# Patient Record
Sex: Female | Born: 1960 | Race: Black or African American | Hispanic: No | Marital: Single | Smoking: Current every day smoker
Health system: Southern US, Community
[De-identification: ages and names within clinical notes are randomized; demographics above are authoritative.]

## PROBLEM LIST (undated history)

## (undated) DIAGNOSIS — M5430 Sciatica, unspecified side: Secondary | ICD-10-CM

## (undated) DIAGNOSIS — G43909 Migraine, unspecified, not intractable, without status migrainosus: Secondary | ICD-10-CM

## (undated) HISTORY — PX: SHOULDER SURGERY: SHX246

## (undated) HISTORY — PX: ABDOMINAL HYSTERECTOMY: SHX81

## (undated) HISTORY — PX: CHOLECYSTECTOMY: SHX55

---

## 2005-10-04 HISTORY — PX: CERVICAL SPINE SURGERY: SHX589

## 2013-05-25 ENCOUNTER — Encounter (HOSPITAL_BASED_OUTPATIENT_CLINIC_OR_DEPARTMENT_OTHER): Payer: Self-pay

## 2013-05-25 ENCOUNTER — Emergency Department (HOSPITAL_BASED_OUTPATIENT_CLINIC_OR_DEPARTMENT_OTHER)
Admission: EM | Admit: 2013-05-25 | Discharge: 2013-05-25 | Disposition: A | Discharge status: Home / Self Care | Payer: Medicare Other | Attending: Emergency Medicine | Admitting: Emergency Medicine

## 2013-05-25 DIAGNOSIS — M5432 Sciatica, left side: Secondary | ICD-10-CM

## 2013-05-25 DIAGNOSIS — G43909 Migraine, unspecified, not intractable, without status migrainosus: Secondary | ICD-10-CM | POA: Insufficient documentation

## 2013-05-25 DIAGNOSIS — M79609 Pain in unspecified limb: Secondary | ICD-10-CM | POA: Insufficient documentation

## 2013-05-25 DIAGNOSIS — F17200 Nicotine dependence, unspecified, uncomplicated: Secondary | ICD-10-CM | POA: Insufficient documentation

## 2013-05-25 DIAGNOSIS — M5430 Sciatica, unspecified side: Secondary | ICD-10-CM | POA: Insufficient documentation

## 2013-05-25 DIAGNOSIS — Z9889 Other specified postprocedural states: Secondary | ICD-10-CM | POA: Insufficient documentation

## 2013-05-25 HISTORY — DX: Migraine, unspecified, not intractable, without status migrainosus: G43.909

## 2013-05-25 HISTORY — DX: Sciatica, unspecified side: M54.30

## 2013-05-25 MED ORDER — DIAZEPAM 5 MG PO TABS: 5.0000 mg | ORAL_TABLET | Freq: Two times a day (BID) | ORAL | Status: DC

## 2013-05-25 MED ORDER — IBUPROFEN 600 MG PO TABS: 600.0000 mg | ORAL_TABLET | Freq: Three times a day (TID) | ORAL | Status: AC

## 2013-05-25 MED ORDER — HYDROMORPHONE HCL PF 1 MG/ML IJ SOLN
1.0000 mg | Freq: Once | INTRAMUSCULAR | Status: AC
  Administered 2013-05-25: 1 mg via INTRAMUSCULAR
  Filled 2013-05-25: qty 1

## 2013-05-25 MED ORDER — OXYCODONE-ACETAMINOPHEN 5-325 MG PO TABS: 2.0000 | ORAL_TABLET | ORAL | Status: DC | PRN

## 2013-05-25 NOTE — ED Provider Notes (Signed)
CSN: [id number]     Arrival date & time 05/25/13  1835 History     First MD Initiated Contact with Patient 05/25/13 1851     Chief Complaint  Patient presents with  . Sciatica   (Consider location/radiation/quality/duration/timing/severity/associated sxs/prior Treatment) HPI Patient presents with pain in her low back, left leg.  This episode began approximately 3 days ago without clear precipitant.  Since onset the pain has been severe, worsening.  Pain radiates down the posterior of the left leg.  There is an associated heavy sensation, but no loss of strength or sensation.  No concurrent incontinence, fever, chills, belly pain. Patient's long history of sciatica, states that this was the same as multiple prior events.   Past Medical History  Diagnosis Date  . Sciatica   . Migraine    Past Surgical History  Procedure Laterality Date  . Shoulder surgery    . Cervical spine surgery    . Abdominal hysterectomy     No family history on file. History  Substance Use Topics  . Smoking status: Current Every Day Smoker  . Smokeless tobacco: Not on file  . Alcohol Use: No   OB History   Grav Para Term Preterm Abortions TAB SAB Ect Mult Living                 Review of Systems  All other systems reviewed and are negative.    Allergies  Review of patient's allergies indicates not on file.  Home Medications   Current Outpatient Rx  Name  Route  Sig  Dispense  Refill  . butalbital-acetaminophen-caffeine (FIORICET WITH CODEINE) 50-325-40-30 MG per capsule   Oral   Take 1 capsule by mouth every 4 (four) hours as needed for headache.         . diazepam (VALIUM) 5 MG tablet   Oral   Take 1 tablet (5 mg total) by mouth 2 (two) times daily.   10 tablet   0   . ibuprofen (ADVIL,MOTRIN) 600 MG tablet   Oral   Take 1 tablet (600 mg total) by mouth 3 (three) times daily.   10 tablet   0   . oxyCODONE-acetaminophen (PERCOCET/ROXICET) 5-325 MG per tablet   Oral   Take 2  tablets by mouth every 4 (four) hours as needed for pain.   6 tablet   0    BP 121/84  Pulse 93  Temp(Src) 98.9 F (37.2 C) (Oral)  Resp 16  Ht 5\' 7"  (1.702 m)  Wt 145 lb (65.772 kg)  BMI 22.71 kg/m2  SpO2 100% Physical Exam  Nursing note and vitals reviewed. Constitutional: She is oriented to person, place, and time. She appears well-developed and well-nourished. No distress.  HENT:  Head: Normocephalic and atraumatic.  Eyes: Conjunctivae and EOM are normal.  Cardiovascular: Normal rate and regular rhythm.   Pulmonary/Chest: Effort normal and breath sounds normal. No stridor. No respiratory distress.  Abdominal: She exhibits no distension.  Musculoskeletal: She exhibits no edema.  No gross deformity.  Positive straight leg test on the left.  Patient can flex and extend both hips, has appropriate range of motion, but has hesitancy to perform range of motion secondary to pain in the left low back, left posterior hip.  Neurological: She is alert and oriented to person, place, and time. No cranial nerve deficit.  Skin: Skin is warm and dry.  Psychiatric: She has a normal mood and affect.    ED Course   Procedures (including critical  care time)  Labs Reviewed - No data to display No results found. 1. Sciatica, left     MDM  Generally well-appearing female presents with left lower back and leg pain.  Given her history of sciatica, her endorsement of similar symptoms in the past, the absence of fever or other red flags, she was started on anti-inflammatories, analgesics and discharged in stable condition.  Gerhard Munch, MD 05/25/13 2027

## 2013-05-25 NOTE — ED Notes (Signed)
Reports been going on for 3 months worse over last 3-4 weeks but appt with neurologist not until mid sept.

## 2013-05-25 NOTE — ED Notes (Signed)
Pt reports "i have sciatic nerve"-pain to lower back and left leg-steady gait into triage

## 2013-05-25 NOTE — ED Notes (Signed)
Pt given dilaudid shot, will wait in consultation room until 2012.

## 2013-06-04 ENCOUNTER — Emergency Department (HOSPITAL_BASED_OUTPATIENT_CLINIC_OR_DEPARTMENT_OTHER)
Admission: EM | Admit: 2013-06-04 | Discharge: 2013-06-04 | Disposition: A | Discharge status: Home / Self Care | Payer: Medicare Other | Attending: Emergency Medicine | Admitting: Emergency Medicine

## 2013-06-04 ENCOUNTER — Encounter (HOSPITAL_BASED_OUTPATIENT_CLINIC_OR_DEPARTMENT_OTHER): Payer: Self-pay | Admitting: *Deleted

## 2013-06-04 DIAGNOSIS — R209 Unspecified disturbances of skin sensation: Secondary | ICD-10-CM | POA: Insufficient documentation

## 2013-06-04 DIAGNOSIS — M79609 Pain in unspecified limb: Secondary | ICD-10-CM | POA: Insufficient documentation

## 2013-06-04 DIAGNOSIS — M5432 Sciatica, left side: Secondary | ICD-10-CM

## 2013-06-04 DIAGNOSIS — Z8679 Personal history of other diseases of the circulatory system: Secondary | ICD-10-CM | POA: Insufficient documentation

## 2013-06-04 DIAGNOSIS — M5430 Sciatica, unspecified side: Secondary | ICD-10-CM | POA: Insufficient documentation

## 2013-06-04 DIAGNOSIS — F17200 Nicotine dependence, unspecified, uncomplicated: Secondary | ICD-10-CM | POA: Insufficient documentation

## 2013-06-04 MED ORDER — DIAZEPAM 5 MG/ML IJ SOLN
5.0000 mg | Freq: Once | INTRAMUSCULAR | Status: AC
  Administered 2013-06-04: 5 mg via INTRAMUSCULAR
  Filled 2013-06-04: qty 2

## 2013-06-04 MED ORDER — HYDROMORPHONE HCL PF 1 MG/ML IJ SOLN
1.0000 mg | Freq: Once | INTRAMUSCULAR | Status: AC
  Administered 2013-06-04: 1 mg via INTRAMUSCULAR
  Filled 2013-06-04: qty 1

## 2013-06-04 MED ORDER — DEXAMETHASONE SODIUM PHOSPHATE 10 MG/ML IJ SOLN
10.0000 mg | Freq: Once | INTRAMUSCULAR | Status: AC
  Administered 2013-06-04: 10 mg via INTRAMUSCULAR
  Filled 2013-06-04: qty 1

## 2013-06-04 MED ORDER — DIAZEPAM 5 MG PO TABS: 5.0000 mg | ORAL_TABLET | Freq: Four times a day (QID) | ORAL | Status: DC | PRN

## 2013-06-04 MED ORDER — OXYCODONE-ACETAMINOPHEN 5-325 MG PO TABS: 1.0000 | ORAL_TABLET | ORAL | Status: DC | PRN

## 2013-06-04 NOTE — ED Notes (Signed)
rx x 2 for percocet and valium- pt has a ride

## 2013-06-04 NOTE — ED Provider Notes (Signed)
CSN: [id number]     Arrival date & time 06/04/13  2013 History   First MD Initiated Contact with Patient 06/04/13 2115     Chief Complaint  Patient presents with  . Back Pain   (Consider location/radiation/quality/duration/timing/severity/associated sxs/prior Treatment) HPI Comments: Patient returns tonight with continued lower back pain with radiation down the back of her left leg - she reports she has an appointment with a neurologist in 20 days but cannot get into see him sooner - she is out of pain medicaiton .  She denies numbness or tingling but reports periods of "weird sensation" to her left thigh and leg, she denies saddle anesthesia, nausea, vomiting, weakness or urinary retention.  Patient is a 52 y.o. female presenting with back pain. The history is provided by the patient. No language interpreter was used.  Back Pain Location:  Lumbar spine and sacro-iliac joint Quality:  Stabbing Radiates to:  L posterior upper leg Pain severity:  Severe Pain is:  Worse during the night Onset quality:  Gradual Timing:  Constant Progression:  Worsening Chronicity:  Chronic Context: physical stress   Context: not emotional stress, not falling, not jumping from heights, not lifting heavy objects, not MVA, not occupational injury, not pedestrian accident, not recent illness, not recent injury and not twisting   Relieved by:  Nothing Worsened by:  Nothing tried Associated symptoms: leg pain and paresthesias   Associated symptoms: no abdominal pain, no bladder incontinence, no bowel incontinence, no chest pain, no dysuria, no fever, no headaches, no numbness, no pelvic pain, no perianal numbness, no tingling, no weakness and no weight loss     Past Medical History  Diagnosis Date  . Sciatica   . Migraine    Past Surgical History  Procedure Laterality Date  . Shoulder surgery    . Cervical spine surgery    . Abdominal hysterectomy     No family history on file. History  Substance Use  Topics  . Smoking status: Current Every Day Smoker  . Smokeless tobacco: Not on file  . Alcohol Use: No   OB History   Grav Para Term Preterm Abortions TAB SAB Ect Mult Living                 Review of Systems  Constitutional: Negative for fever and weight loss.  Cardiovascular: Negative for chest pain.  Gastrointestinal: Negative for abdominal pain and bowel incontinence.  Genitourinary: Negative for bladder incontinence, dysuria and pelvic pain.  Musculoskeletal: Positive for back pain.  Neurological: Positive for paresthesias. Negative for tingling, weakness, numbness and headaches.  All other systems reviewed and are negative.    Allergies  Review of patient's allergies indicates no known allergies.  Home Medications   Current Outpatient Rx  Name  Route  Sig  Dispense  Refill  . butalbital-acetaminophen-caffeine (FIORICET WITH CODEINE) 50-325-40-30 MG per capsule   Oral   Take 1 capsule by mouth every 4 (four) hours as needed for headache.         . diazepam (VALIUM) 5 MG tablet   Oral   Take 1 tablet (5 mg total) by mouth every 6 (six) hours as needed for anxiety.   20 tablet   0   . oxyCODONE-acetaminophen (PERCOCET/ROXICET) 5-325 MG per tablet   Oral   Take 1 tablet by mouth every 4 (four) hours as needed for pain.   20 tablet   0    BP 150/92  Pulse 90  Temp(Src) 98.5 F (36.9 C) (Oral)  Resp 18  Ht 5\' 7"  (1.702 m)  Wt 145 lb (65.772 kg)  BMI 22.71 kg/m2  SpO2 100% Physical Exam  Nursing note and vitals reviewed. Constitutional: She appears well-developed and well-nourished. No distress.  HENT:  Head: Normocephalic and atraumatic.  Right Ear: External ear normal.  Left Ear: External ear normal.  Nose: Nose normal.  Mouth/Throat: Oropharynx is clear and moist. No oropharyngeal exudate.  Eyes: Conjunctivae are normal. Pupils are equal, round, and reactive to light. No scleral icterus.  Neck: Normal range of motion. Neck supple.  Cardiovascular:  Normal rate, regular rhythm and normal heart sounds.  Exam reveals no gallop and no friction rub.   No murmur heard. Pulmonary/Chest: Effort normal and breath sounds normal. No respiratory distress. She has no wheezes. She has no rales. She exhibits no tenderness.  Abdominal: Soft. Bowel sounds are normal. She exhibits no distension. There is no tenderness.  Musculoskeletal: Normal range of motion. She exhibits no edema and no tenderness.  Pain at L4-L5 - pain with straight leg raise and SI joint pain  Lymphadenopathy:    She has no cervical adenopathy.  Neurological: She is alert. She has normal reflexes. No cranial nerve deficit. She exhibits normal muscle tone. Coordination normal.  Skin: Skin is warm and dry. No rash noted. No erythema. No pallor.  Psychiatric: She has a normal mood and affect. Her behavior is normal. Judgment and thought content normal.    ED Course  Procedures (including critical care time) Labs Review Labs Reviewed - No data to display Imaging Review No results found.  MDM   1. Sciatica of left side   No alarming signs of cauda equina, epidural abscess or functional deficits - has appointment and feels bettter after medication here.    Izola Price Marisue Humble, PA-C 06/04/13 2228

## 2013-06-04 NOTE — ED Notes (Signed)
Back pain with radiation into her left leg. States she has a hx of chronic back pain for over 20 years since having an epidural.

## 2013-06-04 NOTE — ED Provider Notes (Signed)
Medical screening examination/treatment/procedure(s) were performed by non-physician practitioner and as supervising physician I was immediately available for consultation/collaboration.    Vida Roller, MD 06/04/13 [phone]

## 2013-07-08 ENCOUNTER — Encounter (HOSPITAL_BASED_OUTPATIENT_CLINIC_OR_DEPARTMENT_OTHER): Payer: Self-pay | Admitting: *Deleted

## 2013-07-08 ENCOUNTER — Emergency Department (HOSPITAL_BASED_OUTPATIENT_CLINIC_OR_DEPARTMENT_OTHER)
Admission: EM | Admit: 2013-07-08 | Discharge: 2013-07-08 | Disposition: A | Discharge status: Home / Self Care | Payer: Medicare Other | Attending: Emergency Medicine | Admitting: Emergency Medicine

## 2013-07-08 DIAGNOSIS — M5430 Sciatica, unspecified side: Secondary | ICD-10-CM | POA: Insufficient documentation

## 2013-07-08 DIAGNOSIS — M5432 Sciatica, left side: Secondary | ICD-10-CM

## 2013-07-08 DIAGNOSIS — R51 Headache: Secondary | ICD-10-CM | POA: Insufficient documentation

## 2013-07-08 DIAGNOSIS — F17200 Nicotine dependence, unspecified, uncomplicated: Secondary | ICD-10-CM | POA: Insufficient documentation

## 2013-07-08 DIAGNOSIS — Z8679 Personal history of other diseases of the circulatory system: Secondary | ICD-10-CM | POA: Insufficient documentation

## 2013-07-08 MED ORDER — METHYLPREDNISOLONE SODIUM SUCC 125 MG IJ SOLR
125.0000 mg | Freq: Once | INTRAMUSCULAR | Status: AC
  Administered 2013-07-08: 125 mg via INTRAVENOUS
  Filled 2013-07-08: qty 2

## 2013-07-08 MED ORDER — METOCLOPRAMIDE HCL 5 MG/ML IJ SOLN
10.0000 mg | Freq: Once | INTRAMUSCULAR | Status: AC
  Administered 2013-07-08: 10 mg via INTRAVENOUS
  Filled 2013-07-08: qty 2

## 2013-07-08 MED ORDER — BUTALBITAL-APAP-CAFF-COD 50-325-40-30 MG PO CAPS: 1.0000 | ORAL_CAPSULE | ORAL | Status: DC | PRN

## 2013-07-08 MED ORDER — DIPHENHYDRAMINE HCL 50 MG/ML IJ SOLN
25.0000 mg | Freq: Once | INTRAMUSCULAR | Status: AC
  Administered 2013-07-08: 25 mg via INTRAVENOUS
  Filled 2013-07-08: qty 1

## 2013-07-08 MED ORDER — KETOROLAC TROMETHAMINE 30 MG/ML IJ SOLN
30.0000 mg | Freq: Once | INTRAMUSCULAR | Status: AC
  Administered 2013-07-08: 30 mg via INTRAVENOUS
  Filled 2013-07-08: qty 1

## 2013-07-08 MED ORDER — SODIUM CHLORIDE 0.9 % IV SOLN
Freq: Once | INTRAVENOUS | Status: AC
  Administered 2013-07-08: 1000 mL via INTRAVENOUS

## 2013-07-08 NOTE — ED Provider Notes (Signed)
Medical screening examination/treatment/procedure(s) were performed by non-physician practitioner and as supervising physician I was immediately available for consultation/collaboration.   Cailean Heacock, MD 07/08/13 1938 

## 2013-07-08 NOTE — ED Notes (Signed)
Pt c/o " migraine"  x 12 hrs no relief from tylenol at home

## 2013-07-08 NOTE — ED Notes (Signed)
Pt states my IV is burning and hurting. Informed pt IV was off and clamped. No swelling nor discoloration noted to IV site.

## 2013-07-08 NOTE — ED Notes (Signed)
Called to patients room, states that she is ready to go home.

## 2013-07-08 NOTE — ED Provider Notes (Signed)
CSN: [id number]     Arrival date & time 07/08/13  1529 History   First MD Initiated Contact with Patient 07/08/13 1539     Chief Complaint  Patient presents with  . Migraine   (Consider location/radiation/quality/duration/timing/severity/associated sxs/prior Treatment) Patient is a 52 y.o. female presenting with migraines. The history is provided by the patient. No language interpreter was used.  Migraine This is a new problem. The current episode started today. The problem occurs constantly. Associated symptoms include headaches. Pertinent negatives include no abdominal pain or joint swelling. Nothing aggravates the symptoms. She has tried nothing for the symptoms. The treatment provided no relief.   Pt complains of a migraine headache Past Medical History  Diagnosis Date  . Sciatica   . Migraine    Past Surgical History  Procedure Laterality Date  . Shoulder surgery    . Cervical spine surgery    . Abdominal hysterectomy     History reviewed. No pertinent family history. History  Substance Use Topics  . Smoking status: Current Every Day Smoker -- 0.50 packs/day    Types: Cigarettes  . Smokeless tobacco: Not on file  . Alcohol Use: No   OB History   Grav Para Term Preterm Abortions TAB SAB Ect Mult Living                 Review of Systems  Gastrointestinal: Negative for abdominal pain.  Musculoskeletal: Negative for joint swelling.  Neurological: Positive for headaches.  All other systems reviewed and are negative.    Allergies  Review of patient's allergies indicates no known allergies.  Home Medications   Current Outpatient Rx  Name  Route  Sig  Dispense  Refill  . butalbital-acetaminophen-caffeine (FIORICET WITH CODEINE) 50-325-40-30 MG per capsule   Oral   Take 1 capsule by mouth every 4 (four) hours as needed for headache.   20 capsule   0   . diazepam (VALIUM) 5 MG tablet   Oral   Take 1 tablet (5 mg total) by mouth every 6 (six) hours as needed for  anxiety.   20 tablet   0   . oxyCODONE-acetaminophen (PERCOCET/ROXICET) 5-325 MG per tablet   Oral   Take 1 tablet by mouth every 4 (four) hours as needed for pain.   20 tablet   0    BP 139/81  Pulse 70  Temp(Src) 98.2 F (36.8 C) (Oral)  Resp 16  Ht 5\' 7"  (1.702 m)  Wt 145 lb (65.772 kg)  BMI 22.71 kg/m2  SpO2 99% Physical Exam  Nursing note and vitals reviewed. Constitutional: She is oriented to person, place, and time. She appears well-developed and well-nourished.  HENT:  Head: Normocephalic.  Eyes: Pupils are equal, round, and reactive to light.  Neck: Normal range of motion.  Cardiovascular: Normal rate.   Pulmonary/Chest: Effort normal.  Abdominal: Soft.  Musculoskeletal: Normal range of motion.  Neurological: She is alert and oriented to person, place, and time. She has normal reflexes.  Skin: Skin is warm.  Psychiatric: She has a normal mood and affect.    ED Course  Procedures (including critical care time) Labs Review Labs Reviewed - No data to display Imaging Review No results found.  MDM   1. Headache   2. Sciatica, left    Pt feels better after iv fluids and medication.   Pt given rx for fiorcet    Elson Areas, PA-C 07/08/13 1911

## 2013-09-20 ENCOUNTER — Encounter (HOSPITAL_BASED_OUTPATIENT_CLINIC_OR_DEPARTMENT_OTHER): Payer: Self-pay | Admitting: Emergency Medicine

## 2013-09-20 ENCOUNTER — Emergency Department (HOSPITAL_BASED_OUTPATIENT_CLINIC_OR_DEPARTMENT_OTHER)
Admission: EM | Admit: 2013-09-20 | Discharge: 2013-09-20 | Disposition: A | Discharge status: Home / Self Care | Payer: Medicare Other | Attending: Emergency Medicine | Admitting: Emergency Medicine

## 2013-09-20 DIAGNOSIS — F17200 Nicotine dependence, unspecified, uncomplicated: Secondary | ICD-10-CM | POA: Insufficient documentation

## 2013-09-20 DIAGNOSIS — G43909 Migraine, unspecified, not intractable, without status migrainosus: Secondary | ICD-10-CM | POA: Insufficient documentation

## 2013-09-20 DIAGNOSIS — M542 Cervicalgia: Secondary | ICD-10-CM | POA: Insufficient documentation

## 2013-09-20 DIAGNOSIS — Z9889 Other specified postprocedural states: Secondary | ICD-10-CM | POA: Insufficient documentation

## 2013-09-20 MED ORDER — OXYCODONE-ACETAMINOPHEN 5-325 MG PO TABS: 1.0000 | ORAL_TABLET | ORAL | Status: DC | PRN

## 2013-09-20 MED ORDER — DIAZEPAM 5 MG PO TABS: 5.0000 mg | ORAL_TABLET | Freq: Four times a day (QID) | ORAL | Status: DC | PRN

## 2013-09-20 MED ORDER — HYDROMORPHONE HCL PF 1 MG/ML IJ SOLN
1.0000 mg | Freq: Once | INTRAMUSCULAR | Status: AC
  Administered 2013-09-20: 1 mg via INTRAMUSCULAR
  Filled 2013-09-20: qty 1

## 2013-09-20 NOTE — ED Provider Notes (Signed)
Medical screening examination/treatment/procedure(s) were performed by non-physician practitioner and as supervising physician I was immediately available for consultation/collaboration.  EKG Interpretation   None         Glynn Octave, MD 09/20/13 2137

## 2013-09-20 NOTE — ED Notes (Signed)
Pt amb to triage with quick steady gait in nad. Pt reports neck pain x 1 week.

## 2013-09-20 NOTE — ED Provider Notes (Signed)
CSN: [id number]     Arrival date & time 09/20/13  1840 History   First MD Initiated Contact with Patient 09/20/13 1922     Chief Complaint  Patient presents with  . Neck Pain   (Consider location/radiation/quality/duration/timing/severity/associated sxs/prior Treatment) Patient is a 52 y.o. female presenting with neck pain. The history is provided by the patient. No language interpreter was used.  Neck Pain Pain location:  L side Quality:  Stiffness and shooting Stiffness is present:  All day Pain radiates to:  Does not radiate Pain severity:  Moderate Duration:  2 weeks Progression:  Unchanged Associated symptoms: no fever, no numbness and no weakness   Associated symptoms comment:  She complains of pain in her left neck for the past 2 weeks without known injury. Pain is getting worse over time. No numbness or tingling, no weakness of upper extremity. The pain is worse when she moves and better at rest.    Past Medical History  Diagnosis Date  . Sciatica   . Migraine    Past Surgical History  Procedure Laterality Date  . Shoulder surgery    . Cervical spine surgery    . Abdominal hysterectomy     History reviewed. No pertinent family history. History  Substance Use Topics  . Smoking status: Current Every Day Smoker -- 0.50 packs/day    Types: Cigarettes  . Smokeless tobacco: Not on file  . Alcohol Use: No   OB History   Grav Para Term Preterm Abortions TAB SAB Ect Mult Living                 Review of Systems  Constitutional: Negative for fever and chills.  Respiratory: Negative.   Cardiovascular: Negative.   Musculoskeletal: Positive for neck pain.       See HPI.  Skin: Negative.   Neurological: Negative.  Negative for weakness and numbness.    Allergies  Review of patient's allergies indicates no known allergies.  Home Medications   Current Outpatient Rx  Name  Route  Sig  Dispense  Refill  . butalbital-acetaminophen-caffeine (FIORICET WITH CODEINE)  50-325-40-30 MG per capsule   Oral   Take 1 capsule by mouth every 4 (four) hours as needed for headache.   20 capsule   0   . diazepam (VALIUM) 5 MG tablet   Oral   Take 1 tablet (5 mg total) by mouth every 6 (six) hours as needed for anxiety.   20 tablet   0   . oxyCODONE-acetaminophen (PERCOCET/ROXICET) 5-325 MG per tablet   Oral   Take 1 tablet by mouth every 4 (four) hours as needed for pain.   20 tablet   0    BP 154/78  Pulse 88  Temp(Src) 98.9 F (37.2 C) (Oral)  Resp 18  SpO2 100% Physical Exam  Constitutional: She is oriented to person, place, and time. She appears well-developed and well-nourished.  Neck: Normal range of motion.  Pulmonary/Chest: Effort normal.  Musculoskeletal:  Left neck tenderness that reproduces pain of complaint. No swelling or midline tenderness. Equal grip strength bilateral upper extremities.   Neurological: She is alert and oriented to person, place, and time.  Normal and equal sensation bilateral upper extremities.   Skin: Skin is warm and dry.  Psychiatric: She has a normal mood and affect.    ED Course  Procedures (including critical care time) Labs Review Labs Reviewed - No data to display Imaging Review No results found.  EKG Interpretation   None  MDM  No diagnosis found. 1. Muscular neck pain  Muscular neck pain uncomplicated, without neurologic deficits.     Arnoldo Hooker, PA-C 09/20/13 2002

## 2013-09-20 NOTE — ED Notes (Signed)
Pt sts she had plate surgicaly placed in neck in 2007; since then about every 3-4 years has pain in neck, for which she has had for 2 weeks now.

## 2013-12-12 ENCOUNTER — Ambulatory Visit: Payer: Medicare Other | Admitting: Physician Assistant

## 2013-12-26 ENCOUNTER — Encounter: Payer: Self-pay | Admitting: Physician Assistant

## 2013-12-26 ENCOUNTER — Ambulatory Visit (INDEPENDENT_AMBULATORY_CARE_PROVIDER_SITE_OTHER): Payer: Medicare Other | Admitting: Physician Assistant

## 2013-12-26 ENCOUNTER — Ambulatory Visit (HOSPITAL_BASED_OUTPATIENT_CLINIC_OR_DEPARTMENT_OTHER)
Admission: RE | Admit: 2013-12-26 | Discharge: 2013-12-26 | Disposition: A | Discharge status: Home / Self Care | Payer: Medicare Other | Source: Ambulatory Visit | Attending: Physician Assistant | Admitting: Physician Assistant

## 2013-12-26 VITALS — BP 140/90 | HR 98 | Temp 98.5°F | Ht 64.0 in | Wt 177.5 lb

## 2013-12-26 DIAGNOSIS — M5440 Lumbago with sciatica, unspecified side: Secondary | ICD-10-CM

## 2013-12-26 DIAGNOSIS — G43909 Migraine, unspecified, not intractable, without status migrainosus: Secondary | ICD-10-CM

## 2013-12-26 DIAGNOSIS — Z1211 Encounter for screening for malignant neoplasm of colon: Secondary | ICD-10-CM

## 2013-12-26 DIAGNOSIS — Z7689 Persons encountering health services in other specified circumstances: Secondary | ICD-10-CM

## 2013-12-26 DIAGNOSIS — Z7189 Other specified counseling: Secondary | ICD-10-CM

## 2013-12-26 DIAGNOSIS — Z8742 Personal history of other diseases of the female genital tract: Secondary | ICD-10-CM

## 2013-12-26 DIAGNOSIS — M542 Cervicalgia: Secondary | ICD-10-CM | POA: Insufficient documentation

## 2013-12-26 DIAGNOSIS — R3989 Other symptoms and signs involving the genitourinary system: Secondary | ICD-10-CM

## 2013-12-26 DIAGNOSIS — M545 Low back pain, unspecified: Secondary | ICD-10-CM

## 2013-12-26 DIAGNOSIS — R202 Paresthesia of skin: Secondary | ICD-10-CM

## 2013-12-26 DIAGNOSIS — M5430 Sciatica, unspecified side: Secondary | ICD-10-CM

## 2013-12-26 DIAGNOSIS — Z981 Arthrodesis status: Secondary | ICD-10-CM | POA: Insufficient documentation

## 2013-12-26 DIAGNOSIS — R209 Unspecified disturbances of skin sensation: Secondary | ICD-10-CM

## 2013-12-26 DIAGNOSIS — R399 Unspecified symptoms and signs involving the genitourinary system: Secondary | ICD-10-CM | POA: Insufficient documentation

## 2013-12-26 LAB — POCT URINALYSIS DIPSTICK
BILIRUBIN UA: NEGATIVE
GLUCOSE UA: NEGATIVE
KETONES UA: NEGATIVE
Leukocytes, UA: NEGATIVE
Nitrite, UA: NEGATIVE
Protein, UA: NEGATIVE
SPEC GRAV UA: 1.01
UROBILINOGEN UA: 0.2
pH, UA: 5.5

## 2013-12-26 MED ORDER — BUTALBITAL-APAP-CAFF-COD 50-325-40-30 MG PO CAPS: 1.0000 | ORAL_CAPSULE | ORAL | Status: AC | PRN

## 2013-12-26 MED ORDER — DIAZEPAM 5 MG PO TABS: 5.0000 mg | ORAL_TABLET | Freq: Four times a day (QID) | ORAL | Status: DC | PRN

## 2013-12-26 MED ORDER — OXYCODONE-ACETAMINOPHEN 5-325 MG PO TABS: 1.0000 | ORAL_TABLET | ORAL | Status: AC | PRN

## 2013-12-26 MED ORDER — CIPROFLOXACIN HCL 500 MG PO TABS: 500.0000 mg | ORAL_TABLET | Freq: Two times a day (BID) | ORAL | Status: AC

## 2013-12-26 NOTE — Patient Instructions (Signed)
Please return for labs.  I will call you with your results.  Take medication as directed for pain and spasm.  Take Fiornal if needed for migraine.  You will be contacted by OB/GYN, Neurology and GI (For Colonoscopy).  If symptoms acutely worsen, please proceed to the ER.    For UTI symptoms -- Take Ciprofloxacin as directed.  Increase fluid intake.  Take a cranberry supplement.  I will call you with the results of your urine culture.

## 2013-12-26 NOTE — Progress Notes (Signed)
Pre visit review using our clinic review tool, if applicable. No additional management support is needed unless otherwise documented below in the visit note. 

## 2013-12-26 NOTE — Assessment & Plan Note (Signed)
S/p Hysterectomy.  Still with symptoms.  Was scheduled for laparoscopy but canceled due to move to [place]. Requesting referral to OB/GYN. Referral granted.

## 2013-12-26 NOTE — Assessment & Plan Note (Signed)
Will obtain x-ray.  Medications for chronic back pain refilled.  Avoid heavy lifting or overexertion.  Referral to Neurology.

## 2013-12-26 NOTE — Assessment & Plan Note (Signed)
Will obtain imaging.  Medications refilled.  Avoid overexertion or heavy lifting.  Referral to Neurology. Patient educated on alarm signs/symptoms and when to proceed to ER if necessary.

## 2013-12-26 NOTE — Assessment & Plan Note (Signed)
Refill Fiorinal.  Referral to Neurology for further evaluation giving failure of standard prophylactic medications.

## 2013-12-26 NOTE — Assessment & Plan Note (Signed)
UA with moderate blood.  Will empirically treat with Cipro BID x 5 days.  Urine sent for culture.

## 2013-12-26 NOTE — Assessment & Plan Note (Signed)
Medical history reviewed.  Patient overdue on Colonoscopy.  Declines flu shot.  Declines Tetanus.  Defers Mammogram to OB/GYN.  Referrals placed to OB/GYN, Neurology and GI.

## 2013-12-26 NOTE — Progress Notes (Signed)
Patient presents to clinic today to establish care.  Acute Concerns: Low Back Pain -- has chronic back pain with radiation down left leg.  Denies hx of injury or truama.  Denies saddle anesthesia, urinary incontinence, urinary retention or change to bowel habits.  Has not had MRI.  Has had x-ray in the past few years but is unsure of result.  Pain has been present for several months.  Has noticed an increase in pain over the past few days associated with some urinary urgency, frequency and dysuria. Denies fever, chills, nausea, vomiting or flank pain. Patient endorses history of UTI.  Chronic Issues: Hx of Endometriosis -- Possible recurrent endometriosis.  Patient is s/p hysterectomy. Was having symptoms prior to her move to [place].  Was evaluated by her previous OB/GYN who felt she may have some remnant endometriosis that was around her ovary (per patient) Was scheduled for laparoscipic procedure but had to cancel.  Needs referral to OB/GYN.  Migraines -- Frequent.  Has tried numerous medications for migraine prophylaxis including metoprolol, Topamax and Elavil.  Patient has also tried Imitrex, Relpax, Excedrin for migraine abortion.  Has most recently been on Fiornal with good relief of symptoms.  Chronic Cervical Neck Pain -- with radicular symptoms.  Endorses spinal fusion several years ago.  Endorses numbness, tingling and weakness of LUE.  Now having some symptoms of RUE. HAs been given Percocet for flare-ups.  Also takes Valium for muscle spasm when she has a flare-up of symptoms.  Health Maintenance: Dental -- Overdue Vision -- UTD Immunizations -- Declines flu shot. Last Tetanus shot unknown. Declines tetanus shot. Colonoscopy -- Needs referral to GI.  Mammogram --Last Mammogram 2 years ago.  Wants to defer to OB/GYN PAP -- S/P Hysterectomy due to endometriosis.    Past Medical History  Diagnosis Date  . Sciatica   . Migraine   . Migraine     Past Surgical History  Procedure  Laterality Date  . Shoulder surgery    . Cervical spine surgery    . Abdominal hysterectomy    . Cholecystectomy      No current outpatient prescriptions on file prior to visit.   No current facility-administered medications on file prior to visit.    No Known Allergies  History reviewed. No pertinent family history.  History   Social History  . Marital Status: Single    Spouse Name: N/A    Number of Children: N/A  . Years of Education: N/A   Occupational History  . Not on file.   Social History Main Topics  . Smoking status: Current Every Day Smoker -- 0.50 packs/day    Types: Cigarettes  . Smokeless tobacco: Never Used  . Alcohol Use: No  . Drug Use: No  . Sexual Activity: Yes    Birth Control/ Protection: Surgical   Other Topics Concern  . Not on file   Social History Narrative  . No narrative on file   Review of Systems  Constitutional: Negative for fever and malaise/fatigue.  HENT: Negative for ear pain, hearing loss and tinnitus.   Eyes: Negative for blurred vision, double vision, photophobia and pain.  Respiratory: Negative for cough, shortness of breath and wheezing.   Cardiovascular: Negative for chest pain and palpitations.  Gastrointestinal: Negative for heartburn, nausea, vomiting, abdominal pain, diarrhea, constipation, blood in stool and melena.  Genitourinary: Positive for dysuria, urgency and frequency. Negative for hematuria and flank pain.  Musculoskeletal: Positive for back pain and neck pain.  Neurological: Positive for  tingling. Negative for dizziness, seizures, loss of consciousness and headaches.  Endo/Heme/Allergies: Negative for environmental allergies.  Psychiatric/Behavioral: Negative for depression, suicidal ideas, hallucinations and substance abuse. The patient is not nervous/anxious and does not have insomnia.    BP 140/90  Pulse 98  Temp(Src) 98.5 F (36.9 C) (Oral)  Ht 5\' 4"  (1.626 m)  Wt 177 lb 8 oz (80.513 kg)  BMI 30.45  kg/m2  SpO2 94%  Physical Exam  Vitals reviewed. Constitutional: She is oriented to person, place, and time and well-developed, well-nourished, and in no distress.  HENT:  Head: Normocephalic and atraumatic.  Eyes: Conjunctivae are normal. Pupils are equal, round, and reactive to light.  Neck: Normal range of motion. Neck supple.  Cardiovascular: Normal rate, regular rhythm, normal heart sounds and intact distal pulses.   Pulmonary/Chest: Effort normal and breath sounds normal. No respiratory distress. She has no wheezes. She has no rales. She exhibits no tenderness.  Abdominal:  Negative CVA tenderness  Musculoskeletal:       Right shoulder: Normal.       Left shoulder: Normal.       Cervical back: She exhibits pain. She exhibits normal range of motion.       Lumbar back: She exhibits tenderness, pain and spasm. She exhibits no bony tenderness.       Right upper arm: Normal.       Left upper arm: Normal.       Right forearm: Normal.       Left forearm: Normal.  Lymphadenopathy:    She has no cervical adenopathy.  Neurological: She is alert and oriented to person, place, and time.  Skin: Skin is warm and dry. No rash noted.  Psychiatric: Affect normal.    Recent Results (from the past 2160 hour(s))  POCT URINALYSIS DIPSTICK     Status: None   Collection Time    12/26/13  5:12 PM      Result Value Ref Range   Color, UA yellow     Clarity, UA cloudy     Glucose, UA negative     Bilirubin, UA negative     Ketones, UA negative     Spec Grav, UA 1.010     Blood, UA large     pH, UA 5.5     Protein, UA negative     Urobilinogen, UA 0.2     Nitrite, UA negative     Leukocytes, UA Negative      Assessment/Plan: Migraine Refill Fiorinal.  Referral to Neurology for further evaluation giving failure of standard prophylactic medications.  UTI symptoms UA with moderate blood.  Will empirically treat with Cipro BID x 5 days.  Urine sent for culture.  Cervical pain  (neck) Will obtain x-ray.  Medications for chronic back pain refilled.  Avoid heavy lifting or overexertion.  Referral to Neurology.  Low back pain with radiation Will obtain imaging.  Medications refilled.  Avoid overexertion or heavy lifting.  Referral to Neurology. Patient educated on alarm signs/symptoms and when to proceed to ER if necessary.  Encounter to establish care Medical history reviewed.  Patient overdue on Colonoscopy.  Declines flu shot.  Declines Tetanus.  Defers Mammogram to OB/GYN.  Referrals placed to OB/GYN, Neurology and GI.  Hx of endometriosis S/p Hysterectomy.  Still with symptoms.  Was scheduled for laparoscopy but canceled due to move to [place]. Requesting referral to OB/GYN. Referral granted.

## 2013-12-27 ENCOUNTER — Other Ambulatory Visit: Payer: Self-pay | Admitting: Physician Assistant

## 2013-12-27 ENCOUNTER — Encounter: Payer: Self-pay | Admitting: Gastroenterology

## 2013-12-27 DIAGNOSIS — M5416 Radiculopathy, lumbar region: Secondary | ICD-10-CM

## 2013-12-27 DIAGNOSIS — M5412 Radiculopathy, cervical region: Secondary | ICD-10-CM

## 2013-12-27 LAB — CBC WITH DIFFERENTIAL/PLATELET

## 2013-12-27 LAB — BASIC METABOLIC PANEL

## 2013-12-27 LAB — SEDIMENTATION RATE

## 2013-12-27 LAB — VITAMIN B12

## 2014-01-09 ENCOUNTER — Telehealth: Payer: Self-pay | Admitting: Physician Assistant

## 2014-01-09 DIAGNOSIS — M5416 Radiculopathy, lumbar region: Secondary | ICD-10-CM

## 2014-01-09 NOTE — Telephone Encounter (Signed)
I will happily order this.  I am assuming she is wanting an MRI of her neck.  Just wanting to verify before I place the order because the patient was seen for both cervical neck pain and lumbar back pain with x-rays of both.

## 2014-01-09 NOTE — Telephone Encounter (Signed)
Patient would like to have an MRI done before she see's the neurologist.

## 2014-01-09 NOTE — Telephone Encounter (Signed)
Please Advise

## 2014-01-10 NOTE — Telephone Encounter (Signed)
Patient informed, understood & agreed/SLS  

## 2014-01-10 NOTE — Telephone Encounter (Signed)
Spoke with patient & she is requesting MRI of Cervical & Lumbar, as she reports that she was seen earlier this week at [facility] for this matter and was informed that "there was some kind of issue in L4-L5 vertebrae". Pt informed that she needs to schedule 30-minute hospital f/u w/i 7-10 days of being seen in ED, understood & agreed/SLS

## 2014-01-10 NOTE — Telephone Encounter (Signed)
Will order MRI of lumbar spine giving recent ER visit results and referral to Neurosurgery is for lumbar radiculopathy.  Will defer MRI cervical spine to Neurosurgery after evaluation.

## 2014-01-12 ENCOUNTER — Encounter (HOSPITAL_BASED_OUTPATIENT_CLINIC_OR_DEPARTMENT_OTHER): Payer: Self-pay | Admitting: Emergency Medicine

## 2014-01-12 ENCOUNTER — Emergency Department (HOSPITAL_BASED_OUTPATIENT_CLINIC_OR_DEPARTMENT_OTHER)
Admission: EM | Admit: 2014-01-12 | Discharge: 2014-01-13 | Disposition: A | Discharge status: Home / Self Care | Payer: Medicare Other | Attending: Emergency Medicine | Admitting: Emergency Medicine

## 2014-01-12 DIAGNOSIS — M5440 Lumbago with sciatica, unspecified side: Secondary | ICD-10-CM

## 2014-01-12 DIAGNOSIS — M48061 Spinal stenosis, lumbar region without neurogenic claudication: Secondary | ICD-10-CM | POA: Insufficient documentation

## 2014-01-12 DIAGNOSIS — Z8679 Personal history of other diseases of the circulatory system: Secondary | ICD-10-CM | POA: Insufficient documentation

## 2014-01-12 DIAGNOSIS — F17200 Nicotine dependence, unspecified, uncomplicated: Secondary | ICD-10-CM | POA: Insufficient documentation

## 2014-01-12 DIAGNOSIS — M545 Low back pain, unspecified: Secondary | ICD-10-CM | POA: Insufficient documentation

## 2014-01-12 DIAGNOSIS — Z9889 Other specified postprocedural states: Secondary | ICD-10-CM | POA: Insufficient documentation

## 2014-01-12 DIAGNOSIS — Z8739 Personal history of other diseases of the musculoskeletal system and connective tissue: Secondary | ICD-10-CM | POA: Insufficient documentation

## 2014-01-12 DIAGNOSIS — Z792 Long term (current) use of antibiotics: Secondary | ICD-10-CM | POA: Insufficient documentation

## 2014-01-12 MED ORDER — DIAZEPAM 5 MG PO TABS: 5.0000 mg | ORAL_TABLET | Freq: Three times a day (TID) | ORAL | Status: AC | PRN

## 2014-01-12 MED ORDER — OXYCODONE-ACETAMINOPHEN 5-325 MG PO TABS: 1.0000 | ORAL_TABLET | ORAL | Status: DC | PRN

## 2014-01-12 MED ORDER — OXYCODONE-ACETAMINOPHEN 5-325 MG PO TABS
1.0000 | ORAL_TABLET | Freq: Once | ORAL | Status: AC
Start: 2014-01-13 — End: 2014-01-13
  Administered 2014-01-13: 1 via ORAL
  Filled 2014-01-12: qty 1

## 2014-01-12 MED ORDER — DIAZEPAM 5 MG PO TABS
5.0000 mg | ORAL_TABLET | Freq: Once | ORAL | Status: AC
  Administered 2014-01-13: 5 mg via ORAL
  Filled 2014-01-12: qty 1

## 2014-01-12 NOTE — Discharge Instructions (Signed)
Back Pain, Adult  Back pain is very common. The pain often gets better over time. The cause of back pain is usually not dangerous. Most people can learn to manage their back pain on their own.   HOME CARE   · Stay active. Start with short walks on flat ground if you can. Try to walk farther each day.  · Do not sit, drive, or stand in one place for more than 30 minutes. Do not stay in bed.  · Do not avoid exercise or work. Activity can help your back heal faster.  · Be careful when you bend or lift an object. Bend at your knees, keep the object close to you, and do not twist.  · Sleep on a firm mattress. Lie on your side, and bend your knees. If you lie on your back, put a pillow under your knees.  · Only take medicines as told by your doctor.  · Put ice on the injured area.  · Put ice in a plastic bag.  · Place a towel between your skin and the bag.  · Leave the ice on for 15-20 minutes, 03-04 times a day for the first 2 to 3 days. After that, you can switch between ice and heat packs.  · Ask your doctor about back exercises or massage.  · Avoid feeling anxious or stressed. Find good ways to deal with stress, such as exercise.  GET HELP RIGHT AWAY IF:   · Your pain does not go away with rest or medicine.  · Your pain does not go away in 1 week.  · You have new problems.  · You do not feel well.  · The pain spreads into your legs.  · You cannot control when you poop (bowel movement) or pee (urinate).  · Your arms or legs feel weak or lose feeling (numbness).  · You feel sick to your stomach (nauseous) or throw up (vomit).  · You have belly (abdominal) pain.  · You feel like you may pass out (faint).  MAKE SURE YOU:   · Understand these instructions.  · Will watch your condition.  · Will get help right away if you are not doing well or get worse.  Document Released: 03/08/2008 Document Revised: 12/13/2011 Document Reviewed: 02/08/2011  [employer]® Patient Information ©2014 [employer].

## 2014-01-12 NOTE — ED Notes (Signed)
Pt reports back pain for "months"- worsening over last 2 weeks

## 2014-01-12 NOTE — ED Provider Notes (Signed)
CSN: [id number]     Arrival date & time 01/12/14  2216 History   First MD Initiated Contact with Patient 01/12/14 2334     Chief Complaint  Patient presents with  . Back Pain     (Consider location/radiation/quality/duration/timing/severity/associated sxs/prior Treatment) Patient is a 53 y.o. female presenting with back pain. The history is provided by the patient. No language interpreter was used.  Back Pain Location:  Lumbar spine Quality:  Aching Radiates to:  L posterior upper leg Pain severity:  Severe Onset quality:  Gradual Duration:  2 weeks Associated symptoms: no abdominal pain, no fever and no numbness   Associated symptoms comment:  She reports known lumbar "stenosis" causing pain into left hip, worsening over the last 2 weeks. She is followed by Dr. Daphine DeutscherMartin who has arranged for pending MRI and referral to neurosurgery. She reports increased pain without any pain medications at home. No urinary or bowel incontinence.   Past Medical History  Diagnosis Date  . Sciatica   . Migraine   . Migraine    Past Surgical History  Procedure Laterality Date  . Shoulder surgery    . Cervical spine surgery    . Abdominal hysterectomy    . Cholecystectomy     No family history on file. History  Substance Use Topics  . Smoking status: Current Every Day Smoker -- 0.50 packs/day    Types: Cigarettes  . Smokeless tobacco: Never Used  . Alcohol Use: No   OB History   Grav Para Term Preterm Abortions TAB SAB Ect Mult Living                 Review of Systems  Constitutional: Negative for fever and chills.  Gastrointestinal: Negative.  Negative for nausea and abdominal pain.  Genitourinary: Negative.  Negative for difficulty urinating.  Musculoskeletal: Positive for back pain.  Skin: Negative.   Neurological: Negative.  Negative for numbness.      Allergies  Review of patient's allergies indicates no known allergies.  Home Medications   Current Outpatient Rx  Name   Route  Sig  Dispense  Refill  . butalbital-acetaminophen-caffeine (FIORICET WITH CODEINE) 50-325-40-30 MG per capsule   Oral   Take 1 capsule by mouth every 4 (four) hours as needed for headache.   20 capsule   0   . ciprofloxacin (CIPRO) 500 MG tablet   Oral   Take 1 tablet (500 mg total) by mouth 2 (two) times daily.   6 tablet   0   . diazepam (VALIUM) 5 MG tablet   Oral   Take 1 tablet (5 mg total) by mouth every 6 (six) hours as needed for anxiety.   15 tablet   0   . oxyCODONE-acetaminophen (PERCOCET/ROXICET) 5-325 MG per tablet   Oral   Take 1 tablet by mouth every 4 (four) hours as needed.   12 tablet   0    BP 146/91  Pulse 115  Temp(Src) 97.8 F (36.6 C) (Oral)  Resp 20  Ht 5\' 7"  (1.702 m)  Wt 165 lb (74.844 kg)  BMI 25.84 kg/m2  SpO2 100% Physical Exam  Constitutional: She appears well-developed and well-nourished.  HENT:  Head: Normocephalic.  Neck: Normal range of motion. Neck supple.  Pulmonary/Chest: Effort normal.  Abdominal: Soft. Bowel sounds are normal. There is no tenderness. There is no rebound and no guarding.  Musculoskeletal: Normal range of motion.  Mild lumbar and paralumbar tenderness without swelling or discoloration.  Neurological: She is alert.  She has normal reflexes. Coordination normal.  Ambulatory without imbalance.   Skin: Skin is warm and dry. No rash noted.  Psychiatric: She has a normal mood and affect.    ED Course  Procedures (including critical care time) Labs Review Labs Reviewed - No data to display Imaging Review No results found.   EKG Interpretation None      MDM   Final diagnoses:  None    1. Lower back pain  No neurologic deficits on exam. Progressive symptoms of chronic back pain with outpatient follow up in place.     Arnoldo Hooker, PA-C 01/12/14 2357

## 2014-01-13 NOTE — ED Provider Notes (Signed)
Medical screening examination/treatment/procedure(s) were performed by non-physician practitioner and as supervising physician I was immediately available for consultation/collaboration.   EKG Interpretation None       Carynn Felling K Ethel Veronica-Rasch, MD 01/13/14 [phone]

## 2014-01-14 ENCOUNTER — Telehealth: Payer: Self-pay | Admitting: Physician Assistant

## 2014-01-14 NOTE — Telephone Encounter (Signed)
Patient informed that unfortunately we will not be able to px pain patch and she will need to continue the oral medications given by PCP & ED [latest 04.11.15] until she sees Neurology/SLS  Pt states that oral medications are not helping with the pain and she is experiencing issues from her cervical down through her legs and her appt with Neurology is not until 04.27.15 and she has previously had pain patch after shoulder surgery/sls Please Advise.

## 2014-01-14 NOTE — Telephone Encounter (Signed)
Patient informed, understood; reports she has appt for MRI on Wednesday, 04.15.15; scheduled appt w/Dr. Abner GreenspanBlyth 04.14.15 at 10:00am/SLS

## 2014-01-14 NOTE — Telephone Encounter (Signed)
Will not be giving pain patch at present.  If symptoms are worsening, she need to return to clinic for reassessment and steroid shot.  Continue narcotic pain medications as prescribed by ER physician.

## 2014-01-14 NOTE — Telephone Encounter (Signed)
Patient called to see if Selena BattenCody would prescribe her a pian patch until she sees her the Neurosurgeon. Please advise

## 2014-01-15 ENCOUNTER — Ambulatory Visit: Payer: Medicare Other | Admitting: Family Medicine

## 2014-01-15 ENCOUNTER — Ambulatory Visit (AMBULATORY_SURGERY_CENTER): Payer: Self-pay

## 2014-01-15 VITALS — Ht 67.0 in | Wt 165.0 lb

## 2014-01-15 DIAGNOSIS — Z0289 Encounter for other administrative examinations: Secondary | ICD-10-CM

## 2014-01-15 DIAGNOSIS — Z1211 Encounter for screening for malignant neoplasm of colon: Secondary | ICD-10-CM

## 2014-01-15 MED ORDER — NA SULFATE-K SULFATE-MG SULF 17.5-3.13-1.6 GM/177ML PO SOLN: ORAL | Status: DC

## 2014-01-17 ENCOUNTER — Telehealth: Payer: Self-pay | Admitting: Physician Assistant

## 2014-01-17 NOTE — Telephone Encounter (Signed)
Reviewed MRI results with patient -- Normal lumbar MRI. Patient voices understanding.

## 2014-01-17 NOTE — Telephone Encounter (Signed)
Patient is requesting MRI results.

## 2014-01-18 ENCOUNTER — Encounter: Payer: Self-pay | Admitting: Physician Assistant

## 2014-01-21 ENCOUNTER — Ambulatory Visit (AMBULATORY_SURGERY_CENTER): Payer: Medicare Other | Admitting: Gastroenterology

## 2014-01-21 ENCOUNTER — Encounter: Payer: Self-pay | Admitting: Gastroenterology

## 2014-01-21 VITALS — BP 144/87 | HR 73 | Temp 97.4°F | Resp 17 | Ht 67.0 in | Wt 165.0 lb

## 2014-01-21 DIAGNOSIS — Z1211 Encounter for screening for malignant neoplasm of colon: Secondary | ICD-10-CM

## 2014-01-21 MED ORDER — SODIUM CHLORIDE 0.9 % IV SOLN: 500.0000 mL | INTRAVENOUS | Status: DC

## 2014-01-21 NOTE — Progress Notes (Signed)
A/ox3 pleased with MAC, report to Karen RN 

## 2014-01-21 NOTE — Op Note (Signed)
[facility] [address] [place], [id number]   COLONOSCOPY PROCEDURE REPORT  PATIENT: Lauren Graham, Lauren D.  MR#: [id number] BIRTHDATE: 15-Oct-1960 , 53  yrs. old GENDER: Female ENDOSCOPIST: Louis Meckelobert D Emili Mcloughlin, MD REFERRED BY: PROCEDURE DATE:  01/21/2014 PROCEDURE:   Colonoscopy, diagnostic First Screening Colonoscopy - Avg.  risk and is 50 yrs.  old or older Yes.  Prior Negative Screening - Now for repeat screening. N/A  History of Adenoma - Now for follow-up colonoscopy & has been > or = to 3 yrs.  N/A  Polyps Removed Today? No.  Recommend repeat exam, <10 yrs? No. ASA CLASS:   Class II INDICATIONS:average risk screening. MEDICATIONS: MAC sedation, administered by CRNA and propofol (Diprivan) 200mg  IV  DESCRIPTION OF PROCEDURE:   After the risks benefits and alternatives of the procedure were thoroughly explained, informed consent was obtained.  A digital rectal exam revealed no abnormalities of the rectum.   The LB [id number]  endoscope was introduced through the anus and advanced to the cecum, which was identified by both the appendix and ileocecal valve. No adverse events experienced.   The quality of the prep was Suprep good  The instrument was then slowly withdrawn as the colon was fully examined.      COLON FINDINGS: A normal appearing cecum, ileocecal valve, and appendiceal orifice were identified.  The ascending, hepatic flexure, transverse, splenic flexure, descending, sigmoid colon and rectum appeared unremarkable.  No polyps or cancers were seen. Retroflexed views revealed no abnormalities. The time to cecum=3 minutes 24 seconds.  Withdrawal time=9 minutes 18 seconds.  The scope was withdrawn and the procedure completed. COMPLICATIONS: There were no complications.  ENDOSCOPIC IMPRESSION: Normal colon  RECOMMENDATIONS: Continue current colorectal screening recommendations for "routine risk" patients with a repeat colonoscopy in 10  years.   eSigned:  Louis Meckelobert D Macsen Nuttall, MD 01/21/2014 3:16 PM   cc: Piedad ClimesWilliam Cody Martin, MD

## 2014-01-21 NOTE — Patient Instructions (Signed)
YOU HAD AN ENDOSCOPIC PROCEDURE TODAY AT THE [facility]: Refer to the procedure report that was given to you for any specific questions about what was found during the examination.  If the procedure report does not answer your questions, please call your gastroenterologist to clarify.  If you requested that your care partner not be given the details of your procedure findings, then the procedure report has been included in a sealed envelope for you to review at your convenience later.  YOU SHOULD EXPECT: Some feelings of bloating in the abdomen. Passage of more gas than usual.  Walking can help get rid of the air that was put into your GI tract during the procedure and reduce the bloating. If you had a lower endoscopy (such as a colonoscopy or flexible sigmoidoscopy) you may notice spotting of blood in your stool or on the toilet paper. If you underwent a bowel prep for your procedure, then you may not have a normal bowel movement for a few days.  DIET: Your first meal following the procedure should be a light meal and then it is ok to progress to your normal diet.  A half-sandwich or bowl of soup is an example of a good first meal.  Heavy or fried foods are harder to digest and may make you feel nauseous or bloated.  Likewise meals heavy in dairy and vegetables can cause extra gas to form and this can also increase the bloating.  Drink plenty of fluids but you should avoid alcoholic beverages for 24 hours.  ACTIVITY: Your care partner should take you home directly after the procedure.  You should plan to take it easy, moving slowly for the rest of the day.  You can resume normal activity the day after the procedure however you should NOT DRIVE or use heavy machinery for 24 hours (because of the sedation medicines used during the test).    SYMPTOMS TO REPORT IMMEDIATELY: A gastroenterologist can be reached at any hour.  During normal business hours, 8:30 AM to 5:00 PM Monday through Friday,  call [phone].  After hours and on weekends, please call the GI answering service at [phone] who will take a message and have the physician on call contact you.   Following lower endoscopy (colonoscopy or flexible sigmoidoscopy):  Excessive amounts of blood in the stool  Significant tenderness or worsening of abdominal pains  Swelling of the abdomen that is new, acute  Fever of 100F or higher    FOLLOW UP: If any biopsies were taken you will be contacted by phone or by letter within the next 1-3 weeks.  Call your gastroenterologist if you have not heard about the biopsies in 3 weeks.  Our staff will call the home number listed on your records the next business day following your procedure to check on you and address any questions or concerns that you may have at that time regarding the information given to you following your procedure. This is a courtesy call and so if there is no answer at the home number and we have not heard from you through the emergency physician on call, we will assume that you have returned to your regular daily activities without incident.  SIGNATURES/CONFIDENTIALITY: You and/or your care partner have signed paperwork which will be entered into your electronic medical record.  These signatures attest to the fact that that the information above on your After Visit Summary has been reviewed and is understood.  Full responsibility of the confidentiality   of this discharge information lies with you and/or your care-partner.     

## 2014-01-22 ENCOUNTER — Telehealth: Payer: Self-pay

## 2014-01-22 NOTE — Telephone Encounter (Signed)
No answer, left vm 

## 2014-01-28 ENCOUNTER — Encounter: Payer: Medicare Other | Admitting: Gastroenterology

## 2014-02-04 ENCOUNTER — Encounter: Payer: Self-pay | Admitting: Family Medicine

## 2014-02-11 ENCOUNTER — Encounter: Payer: Medicare Other | Admitting: Obstetrics & Gynecology

## 2014-05-31 ENCOUNTER — Emergency Department (HOSPITAL_BASED_OUTPATIENT_CLINIC_OR_DEPARTMENT_OTHER)
Admission: EM | Admit: 2014-05-31 | Discharge: 2014-05-31 | Disposition: A | Discharge status: Home / Self Care | Payer: Medicare Other | Attending: Emergency Medicine | Admitting: Emergency Medicine

## 2014-05-31 ENCOUNTER — Encounter (HOSPITAL_BASED_OUTPATIENT_CLINIC_OR_DEPARTMENT_OTHER): Payer: Self-pay | Admitting: Emergency Medicine

## 2014-05-31 DIAGNOSIS — F17200 Nicotine dependence, unspecified, uncomplicated: Secondary | ICD-10-CM | POA: Diagnosis not present

## 2014-05-31 DIAGNOSIS — Z87828 Personal history of other (healed) physical injury and trauma: Secondary | ICD-10-CM | POA: Diagnosis not present

## 2014-05-31 DIAGNOSIS — M5432 Sciatica, left side: Secondary | ICD-10-CM

## 2014-05-31 DIAGNOSIS — M545 Low back pain, unspecified: Secondary | ICD-10-CM | POA: Insufficient documentation

## 2014-05-31 DIAGNOSIS — Z9889 Other specified postprocedural states: Secondary | ICD-10-CM | POA: Insufficient documentation

## 2014-05-31 DIAGNOSIS — Z792 Long term (current) use of antibiotics: Secondary | ICD-10-CM | POA: Insufficient documentation

## 2014-05-31 DIAGNOSIS — G43909 Migraine, unspecified, not intractable, without status migrainosus: Secondary | ICD-10-CM | POA: Insufficient documentation

## 2014-05-31 DIAGNOSIS — M5430 Sciatica, unspecified side: Secondary | ICD-10-CM | POA: Insufficient documentation

## 2014-05-31 MED ORDER — DIAZEPAM 5 MG PO TABS: 5.0000 mg | ORAL_TABLET | Freq: Three times a day (TID) | ORAL | Status: AC | PRN

## 2014-05-31 MED ORDER — HYDROMORPHONE HCL PF 1 MG/ML IJ SOLN
1.0000 mg | Freq: Once | INTRAMUSCULAR | Status: AC
  Administered 2014-05-31: 1 mg via INTRAMUSCULAR
  Filled 2014-05-31: qty 1

## 2014-05-31 MED ORDER — DIAZEPAM 5 MG PO TABS
5.0000 mg | ORAL_TABLET | Freq: Once | ORAL | Status: AC
  Administered 2014-05-31: 5 mg via ORAL
  Filled 2014-05-31: qty 1

## 2014-05-31 MED ORDER — OXYCODONE-ACETAMINOPHEN 5-325 MG PO TABS: 1.0000 | ORAL_TABLET | Freq: Four times a day (QID) | ORAL | Status: AC | PRN

## 2014-05-31 NOTE — ED Notes (Signed)
Lower back pain with radiation into her left hip and leg. Hx of bursitis and DVD.

## 2014-05-31 NOTE — ED Notes (Signed)
Patient requested to immediately be discharged after injection.  EDP updated.
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
## 2014-05-31 NOTE — Discharge Instructions (Signed)
Sciatica Sciatica is pain, weakness, numbness, or tingling along the path of the sciatic nerve. The nerve starts in the lower back and runs down the back of each leg. The nerve controls the muscles in the lower leg and in the back of the knee, while also providing sensation to the back of the thigh, lower leg, and the sole of your foot. Sciatica is a symptom of another medical condition. For instance, nerve damage or certain conditions, such as a herniated disk or bone spur on the spine, pinch or put pressure on the sciatic nerve. This causes the pain, weakness, or other sensations normally associated with sciatica. Generally, sciatica only affects one side of the body. CAUSES   Herniated or slipped disc.  Degenerative disk disease.  A pain disorder involving the narrow muscle in the buttocks (piriformis syndrome).  Pelvic injury or fracture.  Pregnancy.  Tumor (rare). SYMPTOMS  Symptoms can vary from mild to very severe. The symptoms usually travel from the low back to the buttocks and down the back of the leg. Symptoms can include:  Mild tingling or dull aches in the lower back, leg, or hip.  Numbness in the back of the calf or sole of the foot.  Burning sensations in the lower back, leg, or hip.  Sharp pains in the lower back, leg, or hip.  Leg weakness.  Severe back pain inhibiting movement. These symptoms may get worse with coughing, sneezing, laughing, or prolonged sitting or standing. Also, being overweight may worsen symptoms. DIAGNOSIS  Your caregiver will perform a physical exam to look for common symptoms of sciatica. He or she may ask you to do certain movements or activities that would trigger sciatic nerve pain. Other tests may be performed to find the cause of the sciatica. These may include:  Blood tests.  X-rays.  Imaging tests, such as an MRI or CT scan. TREATMENT  Treatment is directed at the cause of the sciatic pain. Sometimes, treatment is not necessary  and the pain and discomfort goes away on its own. If treatment is needed, your caregiver may suggest:  Over-the-counter medicines to relieve pain.  Prescription medicines, such as anti-inflammatory medicine, muscle relaxants, or narcotics.  Applying heat or ice to the painful area.  Steroid injections to lessen pain, irritation, and inflammation around the nerve.  Reducing activity during periods of pain.  Exercising and stretching to strengthen your abdomen and improve flexibility of your spine. Your caregiver may suggest losing weight if the extra weight makes the back pain worse.  Physical therapy.  Surgery to eliminate what is pressing or pinching the nerve, such as a bone spur or part of a herniated disk. HOME CARE INSTRUCTIONS   Only take over-the-counter or prescription medicines for pain or discomfort as directed by your caregiver.  Apply ice to the affected area for 20 minutes, 3-4 times a day for the first 48-72 hours. Then try heat in the same way.  Exercise, stretch, or perform your usual activities if these do not aggravate your pain.  Attend physical therapy sessions as directed by your caregiver.  Keep all follow-up appointments as directed by your caregiver.  Do not wear high heels or shoes that do not provide proper support.  Check your mattress to see if it is too soft. A firm mattress may lessen your pain and discomfort. SEEK IMMEDIATE MEDICAL CARE IF:   You lose control of your bowel or bladder (incontinence).  You have increasing weakness in the lower back, pelvis, buttocks,   or legs.  You have redness or swelling of your back.  You have a burning sensation when you urinate.  You have pain that gets worse when you lie down or awakens you at night.  Your pain is worse than you have experienced in the past.  Your pain is lasting longer than 4 weeks.  You are suddenly losing weight without reason. MAKE SURE YOU:  Understand these  instructions.  Will watch your condition.  Will get help right away if you are not doing well or get worse. Document Released: 09/14/2001 Document Revised: 03/21/2012 Document Reviewed: 01/30/2012 Exit[employer] Patient Information 2015 [employer]. This information is not intended to replace advice given to you by your health care provider. Make sure you discuss any questions you have with your health care provider.  

## 2014-06-20 ENCOUNTER — Emergency Department (HOSPITAL_BASED_OUTPATIENT_CLINIC_OR_DEPARTMENT_OTHER)
Admission: EM | Admit: 2014-06-20 | Discharge: 2014-06-20 | Disposition: A | Discharge status: Home / Self Care | Payer: Medicare Other | Attending: Emergency Medicine | Admitting: Emergency Medicine

## 2014-06-20 ENCOUNTER — Encounter (HOSPITAL_BASED_OUTPATIENT_CLINIC_OR_DEPARTMENT_OTHER): Payer: Self-pay | Admitting: Emergency Medicine

## 2014-06-20 DIAGNOSIS — F17200 Nicotine dependence, unspecified, uncomplicated: Secondary | ICD-10-CM | POA: Diagnosis not present

## 2014-06-20 DIAGNOSIS — Z9889 Other specified postprocedural states: Secondary | ICD-10-CM | POA: Diagnosis not present

## 2014-06-20 DIAGNOSIS — X58XXXA Exposure to other specified factors, initial encounter: Secondary | ICD-10-CM | POA: Insufficient documentation

## 2014-06-20 DIAGNOSIS — Y939 Activity, unspecified: Secondary | ICD-10-CM | POA: Insufficient documentation

## 2014-06-20 DIAGNOSIS — IMO0002 Reserved for concepts with insufficient information to code with codable children: Secondary | ICD-10-CM | POA: Diagnosis not present

## 2014-06-20 DIAGNOSIS — Z87828 Personal history of other (healed) physical injury and trauma: Secondary | ICD-10-CM | POA: Insufficient documentation

## 2014-06-20 DIAGNOSIS — G43909 Migraine, unspecified, not intractable, without status migrainosus: Secondary | ICD-10-CM | POA: Diagnosis not present

## 2014-06-20 DIAGNOSIS — Y929 Unspecified place or not applicable: Secondary | ICD-10-CM | POA: Insufficient documentation

## 2014-06-20 DIAGNOSIS — Z792 Long term (current) use of antibiotics: Secondary | ICD-10-CM | POA: Diagnosis not present

## 2014-06-20 DIAGNOSIS — S39012A Strain of muscle, fascia and tendon of lower back, initial encounter: Secondary | ICD-10-CM

## 2014-06-20 DIAGNOSIS — M549 Dorsalgia, unspecified: Secondary | ICD-10-CM | POA: Insufficient documentation

## 2014-06-20 MED ORDER — CYCLOBENZAPRINE HCL 10 MG PO TABS: 10.0000 mg | ORAL_TABLET | Freq: Two times a day (BID) | ORAL | Status: AC | PRN

## 2014-06-20 MED ORDER — ETODOLAC 500 MG PO TABS: 500.0000 mg | ORAL_TABLET | Freq: Two times a day (BID) | ORAL | Status: AC

## 2014-06-20 MED ORDER — HYDROMORPHONE HCL 1 MG/ML IJ SOLN
1.0000 mg | Freq: Once | INTRAMUSCULAR | Status: AC
  Administered 2014-06-20: 1 mg via INTRAMUSCULAR
  Filled 2014-06-20: qty 1

## 2014-06-20 NOTE — ED Notes (Signed)
C/o lower back pain with "sciatica" Sts MD appointment on Monday but "unable to make it til then."

## 2014-06-20 NOTE — Discharge Instructions (Signed)
Back Pain, Adult °Back pain is very common. The pain often gets better over time. The cause of back pain is usually not dangerous. Most people can learn to manage their back pain on their own.  °HOME CARE  °· Stay active. Start with short walks on flat ground if you can. Try to walk farther each day. °· Do not sit, drive, or stand in one place for more than 30 minutes. Do not stay in bed. °· Do not avoid exercise or work. Activity can help your back heal faster. °· Be careful when you bend or lift an object. Bend at your knees, keep the object close to you, and do not twist. °· Sleep on a firm mattress. Lie on your side, and bend your knees. If you lie on your back, put a pillow under your knees. °· Only take medicines as told by your doctor. °· Put ice on the injured area. °¨ Put ice in a plastic bag. °¨ Place a towel between your skin and the bag. °¨ Leave the ice on for 15-20 minutes, 03-04 times a day for the first 2 to 3 days. After that, you can switch between ice and heat packs. °· Ask your doctor about back exercises or massage. °· Avoid feeling anxious or stressed. Find good ways to deal with stress, such as exercise. °GET HELP RIGHT AWAY IF:  °· Your pain does not go away with rest or medicine. °· Your pain does not go away in 1 week. °· You have new problems. °· You do not feel well. °· The pain spreads into your legs. °· You cannot control when you poop (bowel movement) or pee (urinate). °· Your arms or legs feel weak or lose feeling (numbness). °· You feel sick to your stomach (nauseous) or throw up (vomit). °· You have belly (abdominal) pain. °· You feel like you may pass out (faint). °MAKE SURE YOU:  °· Understand these instructions. °· Will watch your condition. °· Will get help right away if you are not doing well or get worse. °Document Released: 03/08/2008 Document Revised: 12/13/2011 Document Reviewed: 01/22/2014 °[employer]® Patient Information ©2015 [employer]. This information is not intended  to replace advice given to you by your health care provider. Make sure you discuss any questions you have with your health care provider. ° °
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
## 2016-04-01 ENCOUNTER — Emergency Department (HOSPITAL_BASED_OUTPATIENT_CLINIC_OR_DEPARTMENT_OTHER): Payer: Medicare HMO

## 2016-04-01 ENCOUNTER — Emergency Department (HOSPITAL_BASED_OUTPATIENT_CLINIC_OR_DEPARTMENT_OTHER)
Admission: EM | Admit: 2016-04-01 | Discharge: 2016-04-01 | Disposition: A | Discharge status: Home / Self Care | Payer: Medicare HMO | Attending: Emergency Medicine | Admitting: Emergency Medicine

## 2016-04-01 ENCOUNTER — Encounter (HOSPITAL_BASED_OUTPATIENT_CLINIC_OR_DEPARTMENT_OTHER): Payer: Self-pay | Admitting: *Deleted

## 2016-04-01 DIAGNOSIS — F17210 Nicotine dependence, cigarettes, uncomplicated: Secondary | ICD-10-CM | POA: Diagnosis not present

## 2016-04-01 DIAGNOSIS — H5712 Ocular pain, left eye: Secondary | ICD-10-CM | POA: Diagnosis present

## 2016-04-01 DIAGNOSIS — H109 Unspecified conjunctivitis: Secondary | ICD-10-CM | POA: Insufficient documentation

## 2016-04-01 DIAGNOSIS — J069 Acute upper respiratory infection, unspecified: Secondary | ICD-10-CM | POA: Diagnosis not present

## 2016-04-01 DIAGNOSIS — Z79899 Other long term (current) drug therapy: Secondary | ICD-10-CM | POA: Insufficient documentation

## 2016-04-01 LAB — RAPID STREP SCREEN (MED CTR MEBANE ONLY): Streptococcus, Group A Screen (Direct): NEGATIVE

## 2016-04-01 MED ORDER — HYDROCODONE-HOMATROPINE 5-1.5 MG PO TABS: 1.0000 | ORAL_TABLET | Freq: Four times a day (QID) | ORAL | Status: AC | PRN

## 2016-04-01 MED ORDER — PROPARACAINE HCL 0.5 % OP SOLN: 1.0000 [drp] | Freq: Once | OPHTHALMIC | Status: DC

## 2016-04-01 MED ORDER — ERYTHROMYCIN 5 MG/GM OP OINT: TOPICAL_OINTMENT | OPHTHALMIC | Status: AC

## 2016-04-01 MED ORDER — FLUORESCEIN SODIUM 1 MG OP STRP: 1.0000 | ORAL_STRIP | Freq: Once | OPHTHALMIC | Status: DC

## 2016-04-01 NOTE — Discharge Instructions (Signed)

## 2016-04-01 NOTE — ED Notes (Signed)
Redness, drainage and pain to her left eye since this am.
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
## 2016-04-04 LAB — CULTURE, GROUP A STREP (THRC)

## 2018-06-26 IMAGING — CR DG CHEST 2V
2 series · 2 of 2 positions shown · non-contrast
Comparison: None.

CLINICAL DATA: Cough 1-2 weeks.

EXAM:
CHEST  2 VIEW

[w chest pa]
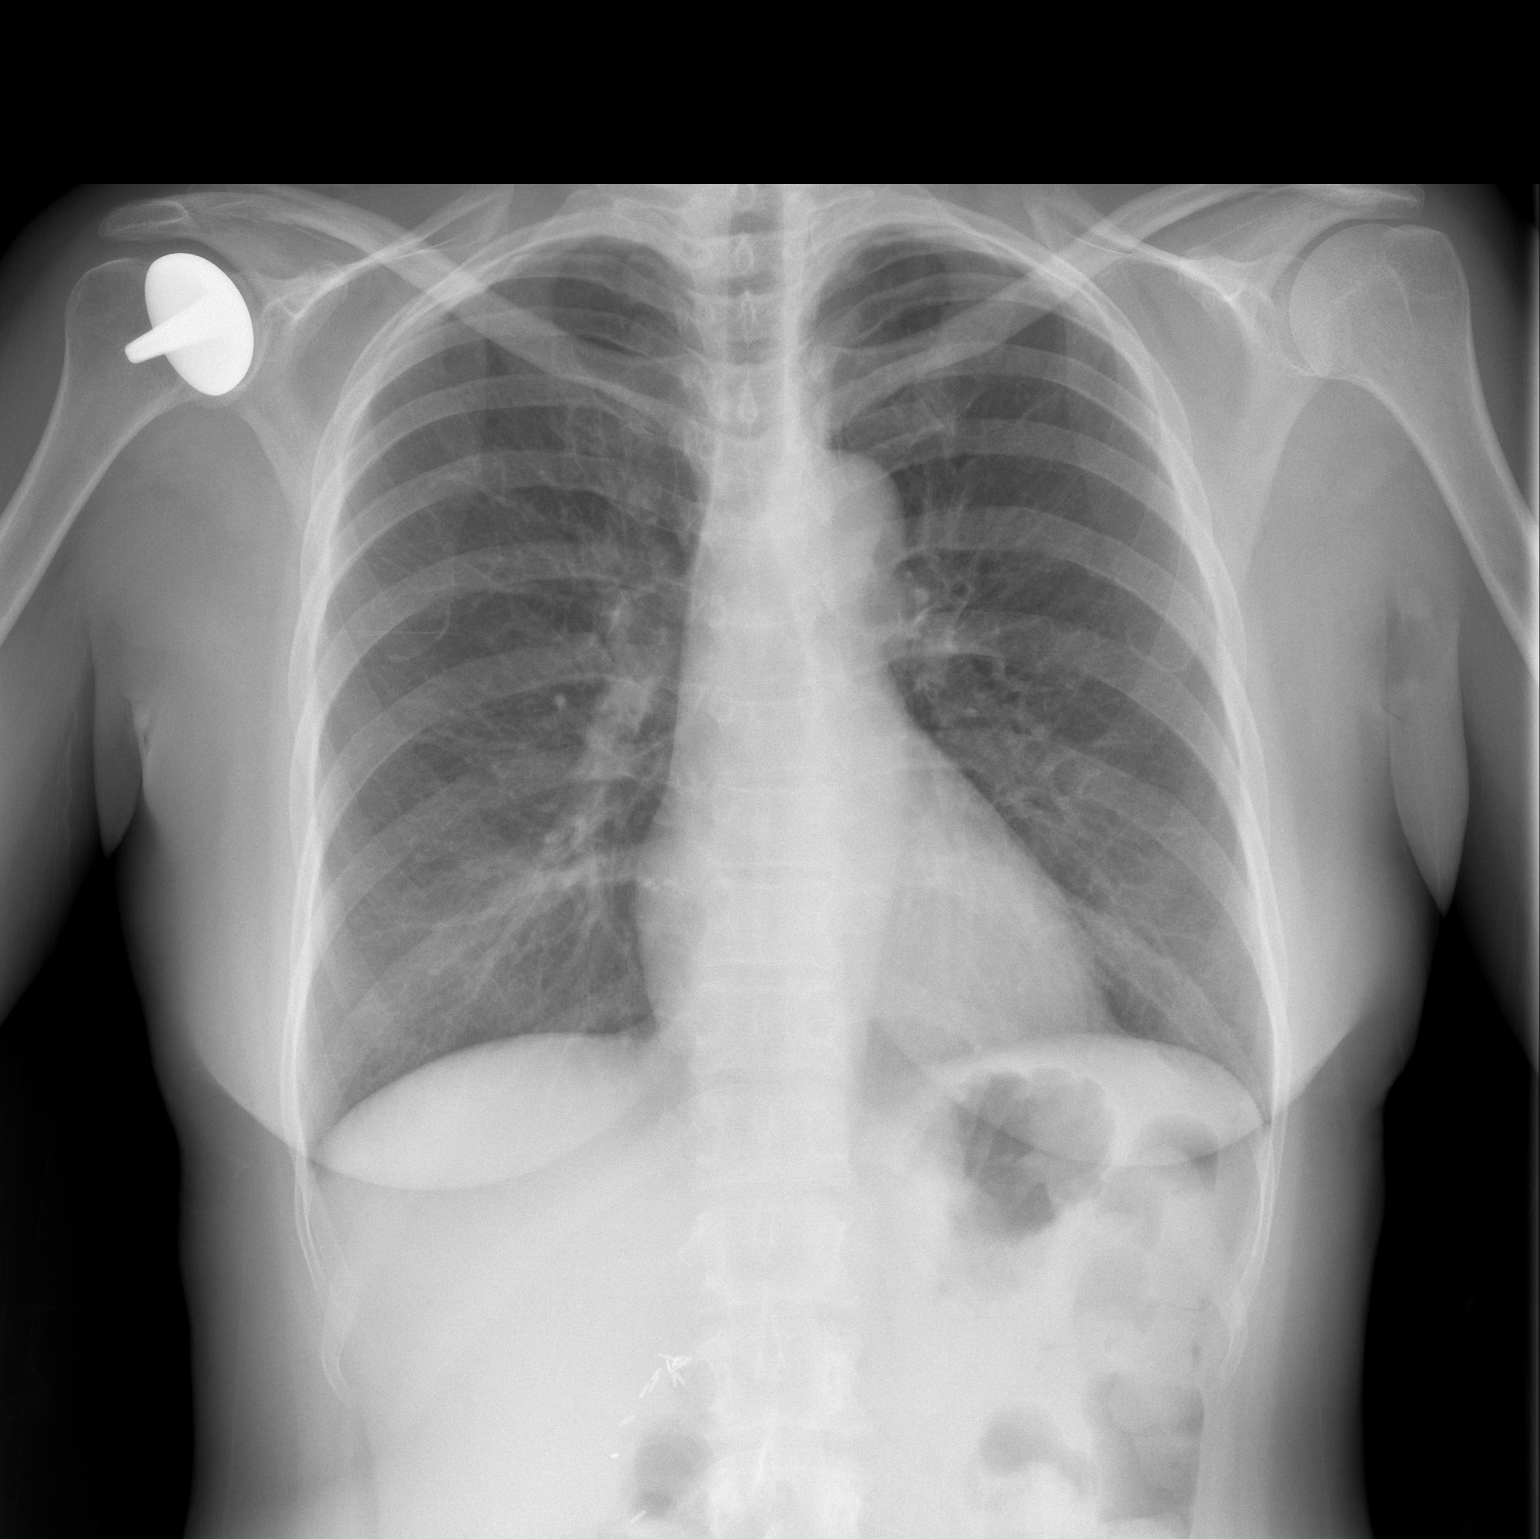

[w chest lat]
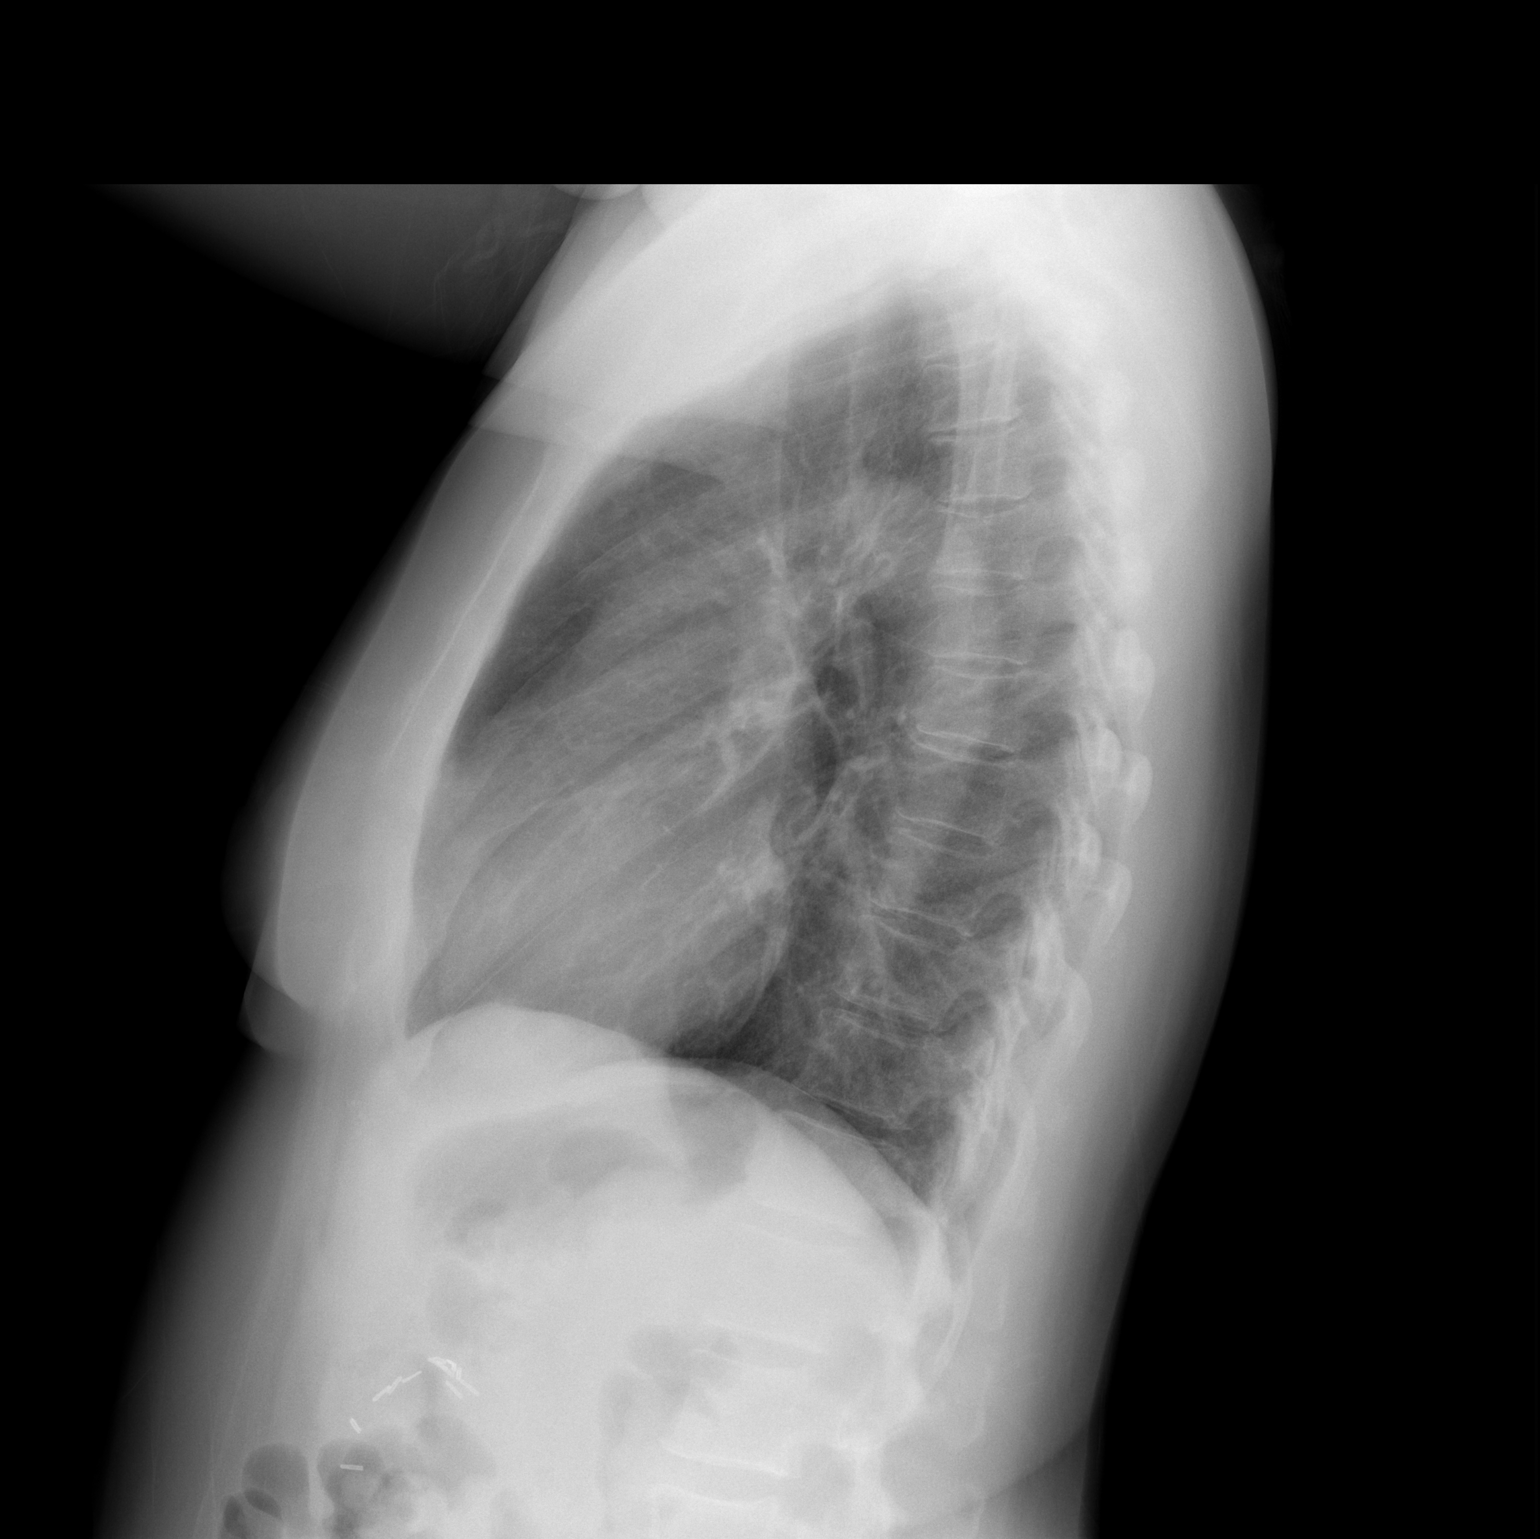

[2 of 2 positions shown; findings below may reference images not displayed]

FINDINGS: Cardiomediastinal silhouette is normal. Mediastinal contours appear
intact.

There is no evidence of focal airspace consolidation, pleural
effusion or pneumothorax.

Osseous structures are without acute abnormality. Postsurgical
changes in the upper mid abdomen. Soft tissues are otherwise grossly
normal.
IMPRESSION: No active cardiopulmonary disease.
